# Patient Record
Sex: Male | Born: 1985 | Race: White | Hispanic: No | Marital: Single | Smoking: Never smoker
Health system: Southern US, Community
[De-identification: ages and names within clinical notes are randomized; demographics above are authoritative.]

## PROBLEM LIST (undated history)

## (undated) DIAGNOSIS — K7581 Nonalcoholic steatohepatitis (NASH): Secondary | ICD-10-CM

## (undated) DIAGNOSIS — Z8619 Personal history of other infectious and parasitic diseases: Secondary | ICD-10-CM

## (undated) HISTORY — DX: Nonalcoholic steatohepatitis (NASH): K75.81

## (undated) HISTORY — DX: Personal history of other infectious and parasitic diseases: Z86.19

## (undated) HISTORY — PX: HERNIA REPAIR: SHX51

---
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
## 2010-01-22 ENCOUNTER — Ambulatory Visit: Payer: Self-pay | Admitting: Family Medicine

## 2010-01-22 DIAGNOSIS — E669 Obesity, unspecified: Secondary | ICD-10-CM

## 2010-01-22 DIAGNOSIS — F639 Impulse disorder, unspecified: Secondary | ICD-10-CM | POA: Insufficient documentation

## 2010-01-22 DIAGNOSIS — R0789 Other chest pain: Secondary | ICD-10-CM | POA: Insufficient documentation

## 2010-01-22 DIAGNOSIS — J309 Allergic rhinitis, unspecified: Secondary | ICD-10-CM | POA: Insufficient documentation

## 2010-02-19 ENCOUNTER — Ambulatory Visit (HOSPITAL_COMMUNITY): Admission: RE | Admit: 2010-02-19 | Discharge: 2010-02-19 | Payer: Self-pay | Admitting: Family Medicine

## 2010-02-20 ENCOUNTER — Ambulatory Visit: Payer: Self-pay | Admitting: Family

## 2010-02-20 ENCOUNTER — Ambulatory Visit: Payer: Self-pay | Admitting: Internal Medicine

## 2010-02-20 LAB — CONVERTED CEMR LAB
ALT: 134 units/L — ABNORMAL HIGH (ref 0–53)
Albumin: 5.3 g/dL — ABNORMAL HIGH (ref 3.5–5.2)
Alkaline Phosphatase: 60 units/L (ref 39–117)
Basophils Absolute: 0.1 10*3/uL (ref 0.0–0.1)
Basophils Relative: 1 % (ref 0–1)
Bilirubin Urine: NEGATIVE
Bilirubin Urine: NEGATIVE
Chloride: 100 meq/L (ref 96–112)
Cholesterol: 220 mg/dL — ABNORMAL HIGH (ref 0–200)
Creatinine, Urine: 260.4 mg/dL
HDL: 49 mg/dL (ref >39)
Hemoglobin, Urine: NEGATIVE
Ketones, urine, test strip: NEGATIVE
LDL Cholesterol: 144 mg/dL — ABNORMAL HIGH (ref 0–99)
MCHC: 34.3 g/dL (ref 30.0–36.0)
Microalb, Ur: 0.5 mg/dL (ref 0.00–1.89)
Neutro Abs: 4.1 10*3/uL (ref 1.7–7.7)
Neutrophils Relative %: 60 % (ref 43–77)
Platelets: 286 10*3/uL (ref 150–400)
Potassium: 4.6 meq/L (ref 3.5–5.3)
Protein, ur: NEGATIVE mg/dL
RDW: 12.4 % (ref 11.5–15.5)
Specific Gravity, Urine: 1.02
Total Protein: 7.9 g/dL (ref 6.0–8.3)
Triglycerides: 137 mg/dL (ref <150)
Urobilinogen, UA: 0.2
Urobilinogen, UA: 0.2 (ref 0.0–1.0)
VLDL: 27 mg/dL (ref 0–40)
pH: 6

## 2010-02-24 ENCOUNTER — Telehealth (INDEPENDENT_AMBULATORY_CARE_PROVIDER_SITE_OTHER): Payer: Self-pay | Admitting: *Deleted

## 2010-02-24 DIAGNOSIS — R740 Nonspecific elevation of levels of transaminase and lactic acid dehydrogenase [LDH]: Secondary | ICD-10-CM

## 2010-02-26 ENCOUNTER — Encounter: Admission: RE | Admit: 2010-02-26 | Discharge: 2010-02-26 | Payer: Self-pay | Admitting: Family Medicine

## 2010-02-27 ENCOUNTER — Ambulatory Visit: Payer: Self-pay | Admitting: Family Medicine

## 2010-02-27 DIAGNOSIS — K7689 Other specified diseases of liver: Secondary | ICD-10-CM

## 2010-03-02 ENCOUNTER — Encounter (INDEPENDENT_AMBULATORY_CARE_PROVIDER_SITE_OTHER): Payer: Self-pay | Admitting: *Deleted

## 2010-03-16 ENCOUNTER — Encounter: Payer: Self-pay | Admitting: Family Medicine

## 2010-03-16 ENCOUNTER — Encounter
Admission: RE | Admit: 2010-03-16 | Discharge: 2010-03-16 | Payer: Self-pay | Source: Home / Self Care | Attending: Family Medicine | Admitting: Family Medicine

## 2010-03-19 ENCOUNTER — Telehealth: Payer: Self-pay | Admitting: Family Medicine

## 2010-05-05 ENCOUNTER — Telehealth (INDEPENDENT_AMBULATORY_CARE_PROVIDER_SITE_OTHER): Payer: Self-pay | Admitting: *Deleted

## 2010-06-18 NOTE — Miscellaneous (Signed)
Summary: Records from 1987 - 2006  Records from 1987 - 2006   Imported By: Maryln Gottron 03/02/2010 13:27:08  _____________________________________________________________________  External Attachment:    Type:   Image     Comment:   External Document

## 2010-06-18 NOTE — Letter (Signed)
Summary: New Patient letter  [facility]  [address] [place], [place] [address]   Phone: [phone]  Fax: [phone]       03/02/2010 MRN: [id number]  George Oconnell [address] [place], [place]  [address]  Dear George Oconnell,  Welcome to the Gastroenterology Division at [facility]    You are scheduled to see Dr.  Marina Goodell on 04-02-10 at 10:00a.m. on the 3rd floor at [facility], [address]  We ask that you try to arrive at our office 15 minutes prior to your appointment time to allow for check-in.  We would like you to complete the enclosed self-administered evaluation form prior to your visit and bring it with you on the day of your appointment.  We will review it with you.  Also, please bring a complete list of all your medications or, if you prefer, bring the medication bottles and we will list them.  Please bring your insurance card so that we may make a copy of it.  If your insurance requires a referral to see a specialist, please bring your referral form from your primary care physician.  Co-payments are due at the time of your visit and may be paid by cash, check or credit card.     Your office visit will consist of a consult with your physician (includes a physical exam), any laboratory testing he/she may order, scheduling of any necessary diagnostic testing (e.g. x-ray, ultrasound, CT-scan), and scheduling of a procedure (e.g. Endoscopy, Colonoscopy) if required.  Please allow enough time on your schedule to allow for any/all of these possibilities.    If you cannot keep your appointment, please call [phone] to cancel or reschedule prior to your appointment date.  This allows [place] the opportunity to schedule an appointment for another patient in need of care.  If you do not cancel or reschedule by 5 p.m. the business day prior to your appointment date, you will be charged a $50.00 late cancellation/no-show fee.    Thank you for  choosing [facility] for your medical needs.  We appreciate the opportunity to care for you.  Please visit [place] at our website  to learn more about our practice.                     Sincerely,                                                             The Gastroenterology Division

## 2010-06-18 NOTE — Progress Notes (Signed)
Summary: GI REFERRAL  Phone Note Outgoing Call Call back at [facility] Phone [phone]   Call placed by: Magdalen Spatz [facility],  March 19, 2010 8:44 AM Call placed to: Patient Summary of Call: IN REFERENCE TO GASTRO REFERRAL........Marland KitchenPATIENT WAS SCH'D TO SEE DR. PERRY ON 04-02-2010, I JUST S/W PATIENT WHO STATES HE HAS DECIDED TO PASS ON SEEING GASTRO & IS CALLING TO CANCEL ABOVE APPT.  Initial call taken by:  Magdalen Spatz [facility]  March 19, 2010 8:44 AM  Follow-up for Phone Call        noted. Follow-up by: Neena Rhymes MD,  March 19, 2010 8:52 AM

## 2010-06-18 NOTE — Progress Notes (Signed)
Summary: nasonex refill   Phone Note Refill Request Message from:  Fax from Pharmacy on May 05, 2010 8:29 AM  Refills Requested: Medication #1:  NASONEX 50 MCG/ACT SUSP 2 sprays each nostril once daily. [facility] [place] - fax [phone] --- *note wants rx for 2 canisters 34 g- patient leaving for 2 months trip to [place] wants to fill before flight*  Initial call taken by: Okey Regal [place],  May 05, 2010 8:30 AM    Prescriptions: NASONEX 50 MCG/ACT SUSP (MOMETASONE FUROATE) 2 sprays each nostril once daily  #2 x 1   Entered by:   Doristine Devoid CMA   Authorized by:   Neena Rhymes MD   Signed by:   Doristine Devoid CMA on 05/05/2010   Method used:   Telephoned to ...       [facility] (retail)       [address]       [place], [place]  [address]       Ph: [phone]       Fax: [phone]   RxID:   [id number]

## 2010-06-18 NOTE — Letter (Signed)
Summary: Student Medical Report/[facility]  Student Medical Report/[facility]   Imported By: Lanelle Bal 03/09/2010 11:30:02  _____________________________________________________________________  External Attachment:    Type:   Image     Comment:   External Document

## 2010-06-18 NOTE — Assessment & Plan Note (Signed)
Summary: cpx//form filled out//lch   Vital Signs:  Patient profile:   25 year old male Height:      69.25 inches Weight:      272 pounds BMI:     40.02 Temp:     97.9 degrees F oral Pulse rate:   66 / minute Pulse rhythm:   regular Resp:     16 per minute BP sitting:   120 / 90  (right arm) Cuff size:   large  Vitals Entered By: Mervin Kung CMA Duncan Dull) (February 20, 2010 8:59 AM) CC: Rm 5  Pt here to have form completed for college. Is Patient Diabetic? No Pain Assessment Patient in pain? no       Vision Screening:Left eye with correction: 20 / 30 Right eye with correction: 20 / 30        Vision Entered By: Mervin Kung CMA Duncan Dull) (February 20, 2010 9:14 AM)  Hearing Screen  20db HL: Left  500 hz: 40db 1000 hz: 40db 2000 hz: 40db 4000 hz: 40db Right  500 hz: 40db 1000 hz: 40db 2000 hz: 40db 4000 hz: 40db   Hearing Testing Entered By: Mervin Kung CMA Duncan Dull) (February 20, 2010 9:55 AM)   CC:  Rm 5  Pt here to have form completed for college.Marland Kitchen  History of Present Illness: Preventative-  Exercises Walks on treadmill, lifts weights.  Exercises 2-3 x a week.  Diet is improving, eating more salads, trying to stay away from fast food.  Preventive Screening-Counseling & Management  Alcohol-Tobacco     Alcohol drinks/day: 0     Smoking Status: never  Caffeine-Diet-Exercise     Caffeine use/day: none     Does Patient Exercise: yes     Times/week: <3  Allergies (verified): No Known Drug Allergies  Past History:  Past Medical History: Last updated: 01/22/2010 Allergic rhinitis hx of chicken pox  Past Surgical History: Last updated: 01/22/2010 Hernia repair as a child   Family History: Reviewed history from 01/22/2010 and no changes required. 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 Resp: Denies cough CV:  Denies Chest Pain or SOB GI:  Denies nausea or vomitting GU: Denies dysuria Lymphatic: Denies lymphadenopathy Musculoskeletal:  Denies muscle/joint pain Skin:  Denies Rashes or lesions Psychiatric: Denies depression or anxiety Neuro: Denies numbness     Physical Exam  General:  Overweight white male, awake, alert, and in NAD Head:  Normocephalic and atraumatic without obvious abnormalities. No apparent alopecia or balding. Eyes:  PERRLA, sclera clear Ears:  External ear exam shows no significant lesions or deformities.  Otoscopic examination reveals clear canals, tympanic membranes are intact bilaterally without bulging, retraction, inflammation or discharge. Hearing is grossly normal bilaterally. Mouth:  Oral mucosa and oropharynx without lesions or exudates.  Teeth in good repair. Neck:  No deformities, masses, or tenderness noted. Chest Wall:  No deformities, masses, tenderness or gynecomastia noted. Lungs:  Normal respiratory effort, chest expands symmetrically. Lungs are clear to auscultation, no crackles or wheezes. Heart:  Normal rate and regular rhythm. S1 and S2 normal without gallop, murmur, click, rub or other extra sounds. Abdomen:  Bowel sounds positive,abdomen soft and non-tender  without masses, organomegaly or hernias noted. Genitalia:  deferred Msk:  No deformity or scoliosis noted of thoracic or lumbar spine.   Pulses:  2+ DP pulses bilaterally Extremities:  No clubbing, cyanosis, edema, or deformity noted with normal full range of motion of all joints.   Neurologic:  No cranial nerve deficits noted. Station and gait are normal. Plantar reflexes are down-going bilaterally. DTRs are symmetrical throughout.  Sensory, motor and coordinative functions appear intact. Skin:  Intact without suspicious lesions or rashes Cervical Nodes:  No lymphadenopathy noted Psych:  Cognition and judgment appear intact. Alert and cooperative with normal attention span and concentration. No apparent delusions, illusions, hallucinations   Impression & Recommendations:  Problem # 1:  Preventive Health Care (ICD-V70.0) Patient declines flu shot today.  Recommended that he continue his healthy dietary changes and exercise efforts.  Form filled.   Orders: TLB-Lipid Panel (80061-LIPID) TLB-BMP (Basic Metabolic Panel-BMET) (80048-METABOL) TLB-CBC Platelet - w/Differential (85025-CBCD) TLB-Hepatic/Liver Function Pnl (80076-HEPATIC) TLB-TSH (Thyroid Stimulating Hormone) (84443-TSH) Audiometry (92552) UA Dipstick w/o Micro (manual) (91478) Specimen Handling (99000) T-Urinalysis (29562-13086) T-Urine Microalbumin w/creat. ratio [phone]) Form Completion (32440)  Problem # 2:  PROTEINURIA, MILD (ICD-791.0) Assessment: New Noted trace protein on dip- will send to lab for verification.  BP has been upper limits of normal last visit 140/80 and this visit 120/90.    Complete Medication List: 1)  Omeprazole 20 Mg Cpdr (Omeprazole) .... Take one tablet daily 2)  Nasonex 50 Mcg/act Susp (Mometasone furoate) .... 2 sprays each nostril once daily  Patient Instructions: 1)  Please keep up the good work with diet and exercise. 2)  Follow up with Dr. Beverely Low in 6 months. 3)  Have a nice trip!  Current Allergies (reviewed today): No known allergies       Contraindications/Deferment of Procedures/Staging:    Test/Procedure: FLU VAX    Reason for deferment: patient declined  Laboratory Results   Urine Tests   Date/Time Reported: Mervin Kung CMA Duncan Dull)  February 20, 2010 9:51 AM   Routine Urinalysis   Color: amber Appearance: Clear Glucose: negative   (Normal Range: Negative) Bilirubin: negative    (Normal Range: Negative) Ketone: negative   (Normal Range: Negative) Spec. Gravity: 1.020   (Normal Range: 1.003-1.035) Blood: negative   (Normal Range: Negative) pH: 6.0   (Normal Range: 5.0-8.0) Protein: trace   (Normal Range: Negative) Urobilinogen: 0.2   (Normal Range: 0-1) Nitrite: negative   (Normal Range: Negative) Leukocyte Esterace: negative   (Normal Range: Negative)    Comments: Urine sent for UA verification. Nicki Guadalajara Fergerson CMA Duncan Dull)  February 20, 2010 9:55 AM

## 2010-06-18 NOTE — Progress Notes (Signed)
  Phone Note Outgoing Call   Summary of Call: Please call patient and let him know that his calcium is high and his LFT's are high.  I would like for him to have an ultrasound of his liver (this has been ordered) Please inform patient and arrange a follow up visit with Dr. Beverely Low.   Initial call taken by: Lemont Fillers FNP,  February 24, 2010 8:22 AM  Follow-up for Phone Call        Pt notified of results and states he will call back to schedule time for U/s. Nicki Guadalajara Fergerson CMA Duncan Dull)  February 24, 2010 10:50 AM   Additional Follow-up for Phone Call Additional follow up Details #1::        please call pt and let him know it may be easier for [place] to schedule [place] and then have him change it if it isn't convenient. Additional Follow-up by: Neena Rhymes MD,  February 25, 2010 7:57 AM  New Problems: HYPERCALCEMIA (ICD-275.42) TRANSAMINASES, SERUM, ELEVATED (ICD-790.4)   Additional Follow-up for Phone Call Additional follow up Details #2::    Pt dad call to get some clarity on what was going on with pt. Inform pt dad of pt lab results and U/S that needs to be scheduled. Pt dad states that pt was a little hesitate to have procedure done but will discuss with pt and stress the important of having procedure done, Per pt dad pt will be back in touch in regard to getting U/S done...........Marland KitchenFelecia Deloach CMA  February 25, 2010 10:51 AM   New Problems: HYPERCALCEMIA (ICD-275.42) TRANSAMINASES, SERUM, ELEVATED (ICD-790.4)   Immunization History:  DPT Immunization History:    DPT # 1:  historical (05/14/1986)    DPT # 2:  historical (08/22/1986)    DPT # 3:  historical (09/10/1987)    DPT # 4:  historical (02/03/1989)    DPT # 5:  historical (03/07/1990)  HIB Immunization History:    HIB # 1:  historical (01/29/1988)    HIB # 2:  historical (05/27/1988)  Polio Immunization History:    Polio # 1:  historical (05/14/1986)    Polio # 2:  historical (08/22/1986)    Polio # 3:   historical (09/10/1987)    Polio # 4:  historical (03/07/1990)  MMR Immunization History:    MMR # 1:  historical (09/10/1987)    Preventive Care Screening  PPD:    Date:  12/25/2004    Results:  negative

## 2010-06-18 NOTE — Consult Note (Signed)
Summary: [facility]  [facility]   Imported By: Lanelle Bal 03/26/2010 13:45:34  _____________________________________________________________________  External Attachment:    Type:   Image     Comment:   External Document

## 2010-06-18 NOTE — Assessment & Plan Note (Signed)
Summary: review U/S results//fd   Vital Signs:  Patient profile:   25 year old male Height:      69.25 inches Weight:      275 pounds Temp:     98.1 degrees F oral Pulse rate:   80 / minute BP sitting:   118 / 82  (left arm)  Vitals Entered By: Jeremy Johann CMA (February 27, 2010 11:03 AM) CC: review U/S result   History of Present Illness: 25 yo man here today to review [place] results.  pt denies ETOH or tylenol use.  no abd pain.  not exercising.  poor diet.  Preventive Screening-Counseling & Management  Alcohol-Tobacco     Alcohol drinks/day: 0  Caffeine-Diet-Exercise     Does Patient Exercise: no  Current Medications (verified): 1)  Omeprazole 20 Mg Cpdr (Omeprazole) .... Take One Tablet Daily 2)  Nasonex 50 Mcg/act Susp (Mometasone Furoate) .... 2 Sprays Each Nostril Once Daily  Allergies (verified): No Known Drug Allergies  Past History:  Past Medical History: Allergic rhinitis hx of chicken pox steatohepatosis  Social History: Does Patient Exercise:  no  Review of Systems      See HPI  Physical Exam  General:  Overweight white male, awake, alert, and in NAD Psych:  mildly anxious   Impression & Recommendations:  Problem # 1:  FATTY LIVER DISEASE (ICD-571.8) Assessment New reviewed importance of healthy diet and regular exercise to prevent further fat deposition in liver.  will refer to nutrition.  Problem # 2:  TRANSAMINASES, SERUM, ELEVATED (ICD-790.4) Assessment: Unchanged wanted to repeat labs today and add hepatitis panel but pt declined.  wants to wait until he works on diet and exercise before having labs repeated.  Complete Medication List: 1)  Omeprazole 20 Mg Cpdr (Omeprazole) .... Take one tablet daily 2)  Nasonex 50 Mcg/act Susp (Mometasone furoate) .... 2 sprays each nostril once daily  Patient Instructions: 1)  Follow up in 6-8 weeks for liver issues 2)  I'll make your nutrition and GI appts 3)  Call with any questions or  concerns  Appended Document: review U/S results//fd    Clinical Lists Changes  Orders: Added new Referral order of Nutrition Referral (Nutrition) - Signed Added new Referral order of Gastroenterology Referral (GI) - Signed

## 2010-06-18 NOTE — Assessment & Plan Note (Signed)
Summary: new to est/cbs   Vital Signs:  Patient profile:   25 year old male Height:      69.25 inches Weight:      274 pounds BMI:     40.32 Pulse rate:   109 / minute BP sitting:   140 / 80  (left arm)  Vitals Entered By: Doristine Devoid CMA (January 22, 2010 3:18 PM) CC: new est- vibrating feeling in throat    History of Present Illness: 25 yo man here today to establish care.  Previous MD- [facility].  GI- [facility].  'strange feeling when i breathe'- sxs started 6 months ago.  had upper GI which was 'normal', started on PPI.  'there's a vibration in my chest'- particularly w/ a deep breath.  sxs were preceeding by URI.  denies SOB.  + 'mucousy feeling in my throat'.  + cough.  hx of seasonal allergies- not currently on meds.  denies CP pain or pressure.  feeling is less noticeable in the AM, more noticeable at night.   obesity- pt's BMI >40.  pt reports he has a hx of compulsive behaviors- first spending money, now eating.  family hx of addiction- brother is recovering substance abuser.  open to idea of exercise.  denies SI/HI.  father also obese- they admit to poor eating routinely.  Preventive Screening-Counseling & Management  Alcohol-Tobacco     Alcohol drinks/day: 0     Smoking Status: never  Caffeine-Diet-Exercise     Does Patient Exercise: no      Drug Use:  never.    Current Medications (verified): 1)  Omeprazole 20 Mg Cpdr (Omeprazole) .... 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 CV:  Complains of weight gain; denies chest pain or discomfort, difficulty breathing at night, fainting, fatigue, palpitations, swelling of feet, and swelling of hands. Resp:  Complains of chest discomfort, cough, and sputum productive; denies wheezing. GI:  Denies abdominal pain, change in bowel habits, nausea, and vomiting. Psych:  Denies anxiety, depression, panic attacks, suicidal thoughts/plans, thoughts of violence, and unusual visions or sounds.  Physical Exam  General:  Obese, well-developed,well-nourished,in no acute distress; alert,appropriate and cooperative throughout examination Head:  Normocephalic and atraumatic without obvious abnormalities. No apparent alopecia or balding. Ears:  External ear exam shows no significant lesions or deformities.  Otoscopic examination reveals clear canals, tympanic membranes are intact bilaterally without bulging, retraction, inflammation or discharge. Hearing is grossly normal bilaterally. Nose:  marked turbinate edema Mouth:  copious PND Neck:  No deformities, masses, or tenderness noted. Lungs:  Normal respiratory effort, chest expands symmetrically. Lungs are clear to auscultation, no crackles or wheezes. Heart:  Normal rate and regular rhythm. S1 and S2 normal without gallop, murmur, click, rub or other extra sounds. Abdomen:  obese, soft, NT/ND Skin:  turgor normal, color normal, and no rashes.   Cervical Nodes:  No lymphadenopathy noted Psych:  Cognition and judgment appear intact. Alert and cooperative with normal attention span and concentration.  No apparent delusions, illusions, hallucinations   Impression & Recommendations:  Problem # 1:  CHEST DISCOMFORT (ICD-786.59) Assessment New check CXR.  pt's sxs don't appear to be cardiac based on hx.  sxs more consistent w/ allergic rhinitis and PND.  start nasal steriod  in addition to PPI.  add OTC antihistamine.  reviewed supportive care and red flags that should prompt return.  Pt expresses understanding and is in agreement w/ this plan. Orders: T-2 View CXR (71020TC)  Problem # 2:  ALLERGIC RHINITIS (ICD-477.9) Assessment: New see above His updated medication list for this problem includes:    Nasonex 50 Mcg/act Susp (Mometasone furoate) .Marland Kitchen... 2 sprays each nostril once daily  Problem # 3:  OBESITY (ICD-278.00) Assessment: New pt is compulsively eating.  strongly recommended counseling in addition to healthy diet and regular exercise.  will follow closely.  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  pt to seek out counseling at school.  will follow.  Complete Medication List: 1)  Omeprazole 20 Mg Cpdr (Omeprazole) .... Take one tablet daily 2)  Nasonex 50 Mcg/act Susp (Mometasone furoate) .... 2 sprays each nostril once daily  Patient Instructions: 1)  Please schedule a follow-up appointment in 1 month for your complete physical- do not eat before this appt. 2)  Start the nasonex daily as directed- this will help with the postnasal drip 3)  Go to [address] to get your chest xray 4)  Add over the counter Claritin or Zyrtec for the allergy component 5)  Check into counseling at school 6)  Try and get regular exercise- at least 3 times/week for at least 30 minutes 7)  Call with any questions or concerns 8)  Welcome!  We're glad to have you!  Prescriptions: NASONEX 50 MCG/ACT SUSP (MOMETASONE FUROATE) 2 sprays each nostril once daily  #1 x 3   Entered and Authorized by:   Neena Rhymes MD   Signed by:   Neena Rhymes MD on 01/22/2010   Method used:   Print then Give to Patient   RxID:   [id number]

## 2010-10-14 ENCOUNTER — Encounter: Payer: Self-pay | Admitting: Family Medicine

## 2010-10-14 ENCOUNTER — Ambulatory Visit (INDEPENDENT_AMBULATORY_CARE_PROVIDER_SITE_OTHER): Payer: BC Managed Care – PPO | Admitting: Family Medicine

## 2010-10-14 VITALS — BP 124/86 | Temp 97.8°F | Wt 273.0 lb

## 2010-10-14 DIAGNOSIS — K7689 Other specified diseases of liver: Secondary | ICD-10-CM

## 2010-10-14 NOTE — Patient Instructions (Signed)
Follow up in 6 months for your complete physical We'll notify you of your lab results Continue to make healthy food choices and get regular exercise Call with any questions or concerns Have a great time in [place]!!!

## 2010-10-14 NOTE — Progress Notes (Signed)
  Subjective:    Patient ID: George Oconnell, male    DOB: Feb 14, 1986, 25 y.o.   MRN: [id number]  HPI  Fatty liver- at last visit pt had decided to work on diet and exercise.  Reports he did a lot of exercise in [place] and now clothes are too big but weight is stable from last visit.  Denies jaundice- reports yellowed finger nails are from recent hair dye.  Toenails are not yellowed.  No N/V.  Review of Systems For ROS see HPI     Objective:   Physical Exam  Constitutional: He is oriented to person, place, and time. He appears well-developed and well-nourished. No distress.       overweight  Eyes: Conjunctivae and EOM are normal. Pupils are equal, round, and reactive to light.  Neck: Neck supple. No thyromegaly present.  Cardiovascular: Normal rate, regular rhythm, normal heart sounds and intact distal pulses.   No murmur heard. Pulmonary/Chest: Effort normal and breath sounds normal. No respiratory distress. He has no wheezes.  Abdominal: Soft. Bowel sounds are normal. He exhibits no distension. There is no tenderness. There is no rebound.  Musculoskeletal: He exhibits no edema.  Lymphadenopathy:    He has no cervical adenopathy.  Neurological: He is alert and oriented to person, place, and time. No cranial nerve deficit. Coordination normal.  Skin: Skin is warm and dry.       No apparent jaundice          Assessment & Plan:

## 2010-10-15 ENCOUNTER — Encounter: Payer: Self-pay | Admitting: *Deleted

## 2010-10-15 LAB — HEPATIC FUNCTION PANEL
ALT: 99 U/L — ABNORMAL HIGH (ref 0–53)
AST: 59 U/L — ABNORMAL HIGH (ref 0–37)
Albumin: 4.5 g/dL (ref 3.5–5.2)
Total Bilirubin: 0.7 mg/dL (ref 0.3–1.2)

## 2010-10-15 LAB — LIPID PANEL
Cholesterol: 178 mg/dL (ref 0–200)
LDL Cholesterol: 107 mg/dL — ABNORMAL HIGH (ref 0–99)
Triglycerides: 83 mg/dL (ref 0.0–149.0)

## 2010-10-18 NOTE — Assessment & Plan Note (Signed)
Pt has improved diet and was walking regularly while in [place].  Due for repeat labs today.  Encouraged him to continue his healthy lifestyle even though he has returned from his time abroad.  Will follow.

## 2011-02-10 IMAGING — CR DG CHEST 2V
2 series · 2 of 2 positions shown · non-contrast
Comparison: None.

CLINICAL DATA: 23-year-old male with chest discomfort.

CHEST - 2 VIEW

[w chest pa]
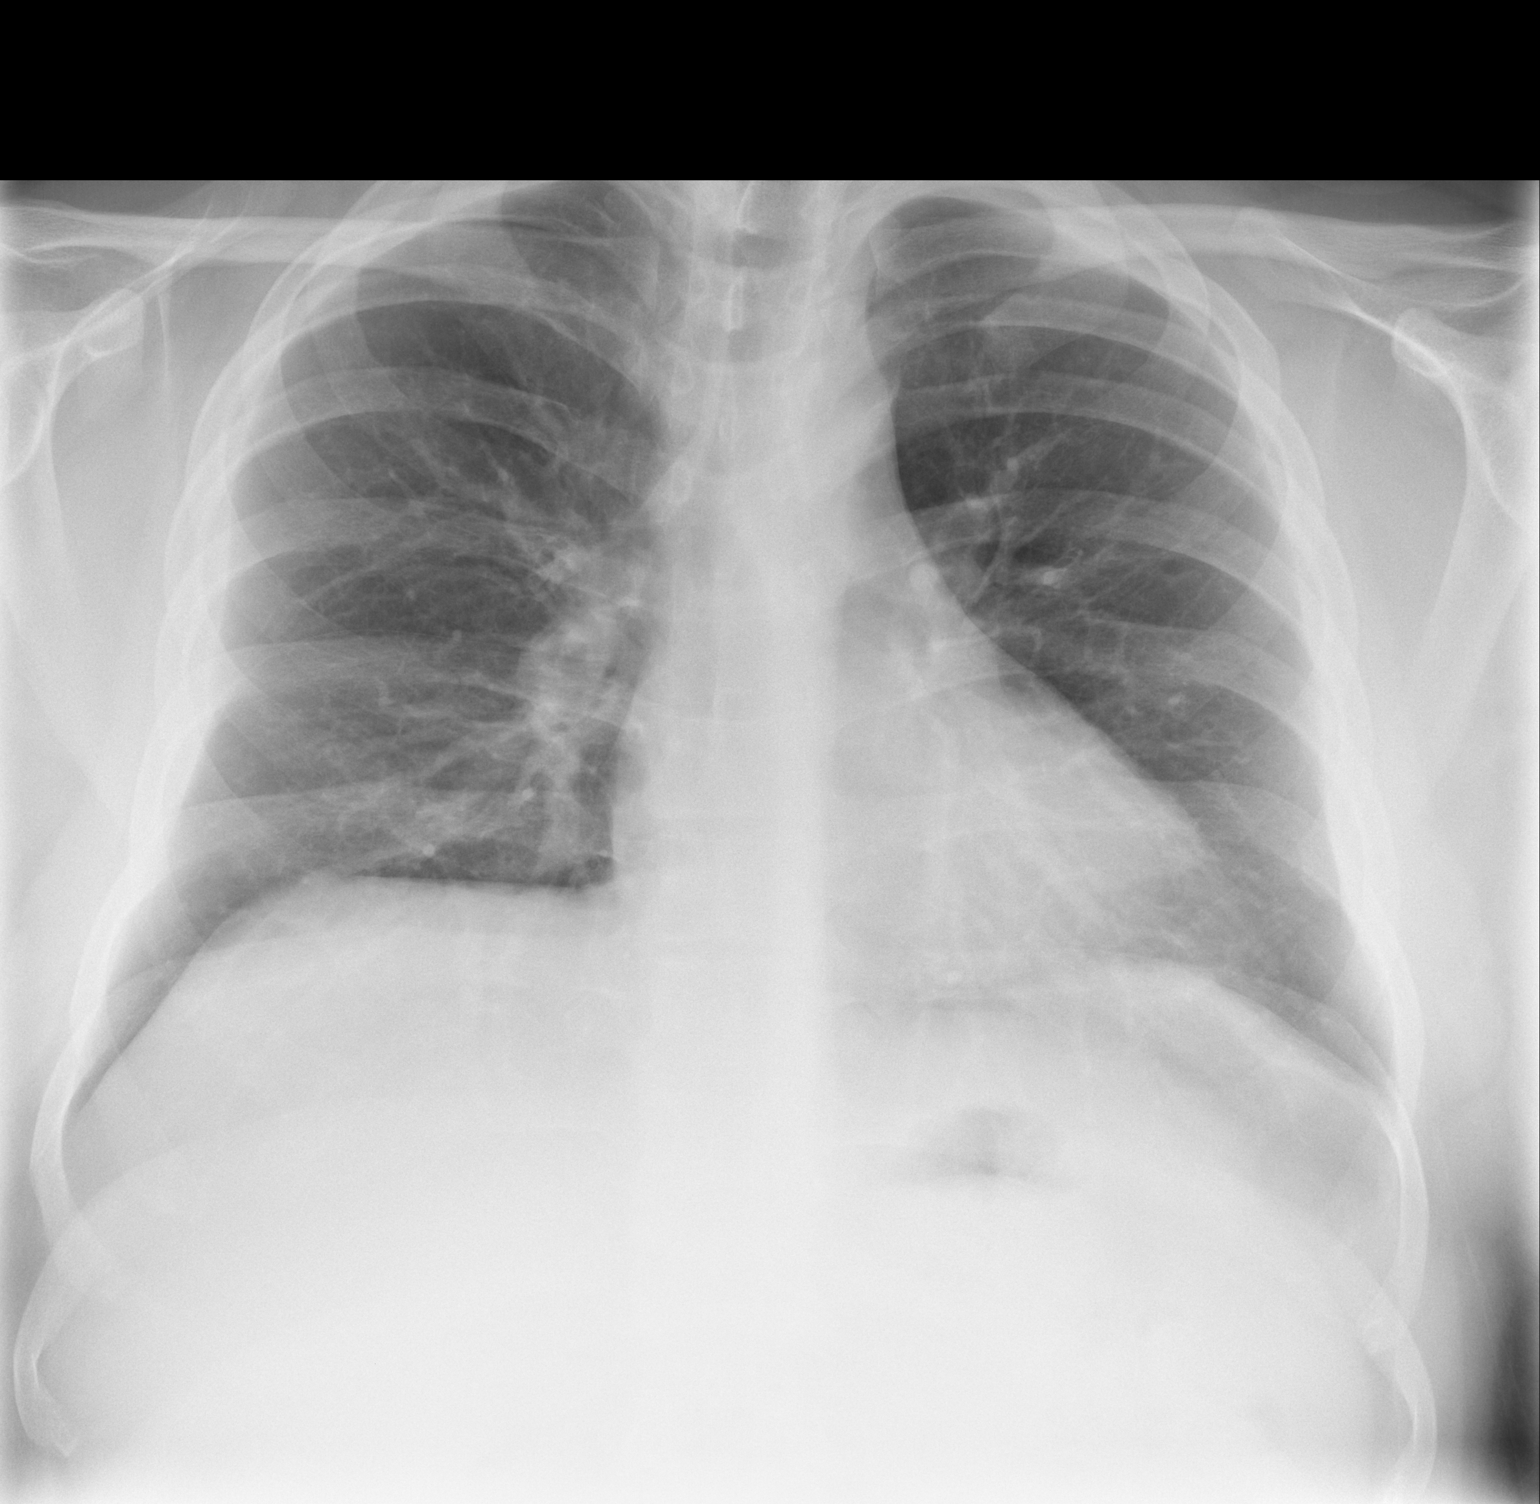

[w chest lat]
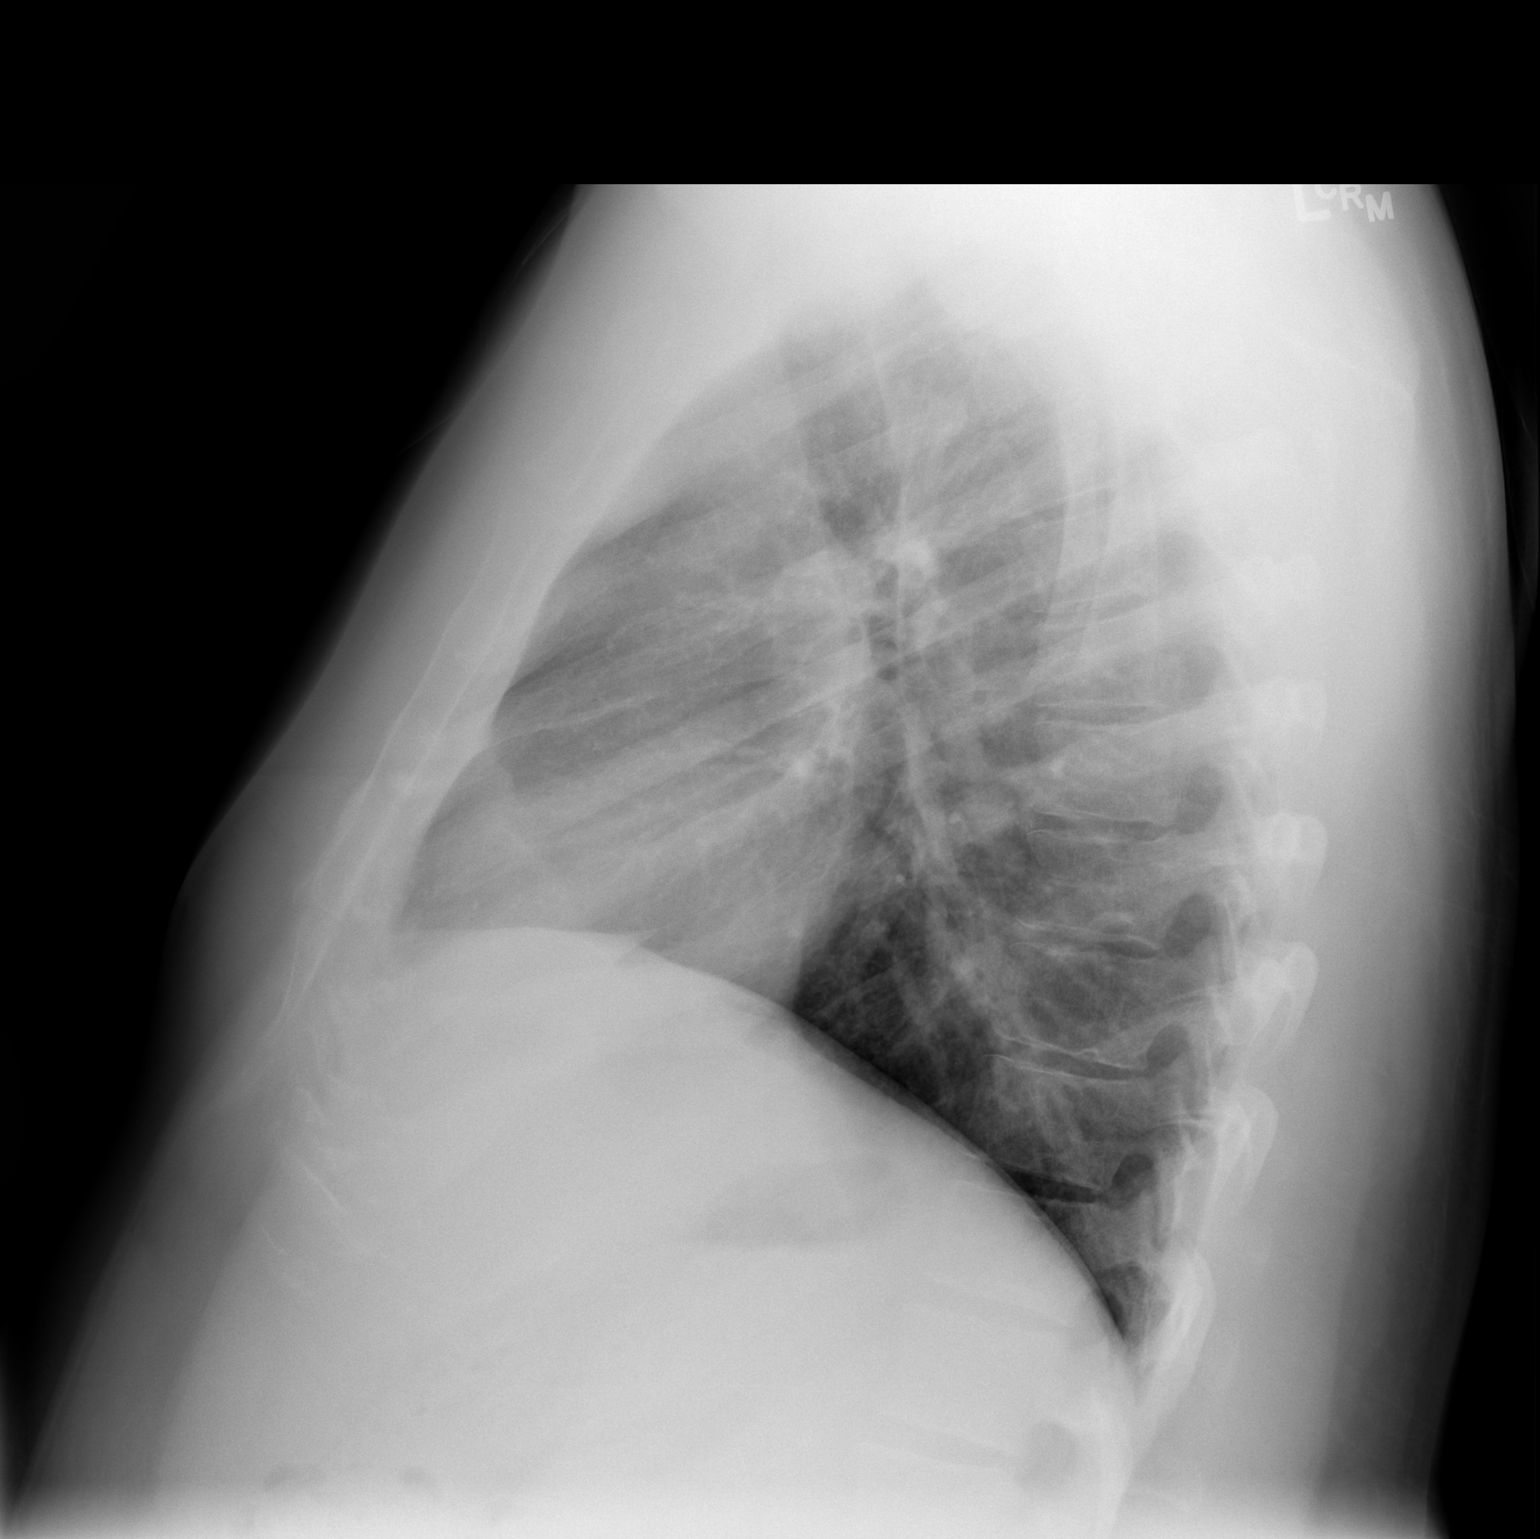

[2 of 2 positions shown; findings below may reference images not displayed]

FINDINGS: Slightly shallow lung volumes. Normal cardiac size and
mediastinal contours.  Visualized tracheal air column is within
normal limits.  The lungs are clear.  No pleural effusion or
pneumothorax. No acute osseous abnormality identified.
IMPRESSION: Slightly shallow lung volumes, otherwise negative.

## 2011-02-17 IMAGING — US US ABDOMEN COMPLETE
1 series · 14 of 25 positions shown · non-contrast
Comparison: None.

CLINICAL DATA: 24-year-old male with elevated liver function test.

COMPLETE ABDOMINAL ULTRASOUND

[Series 1: us abdomen complete · 0.32mm/px · 14 of 69 slices shown]
[im 1/69]
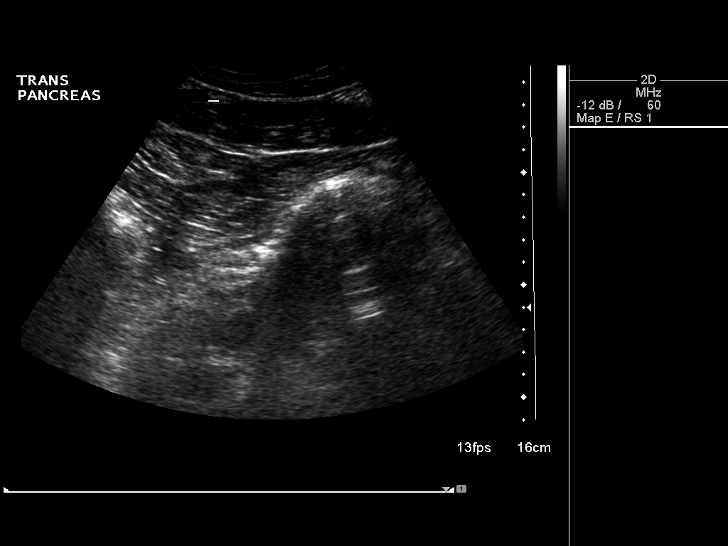
[im 6/69]
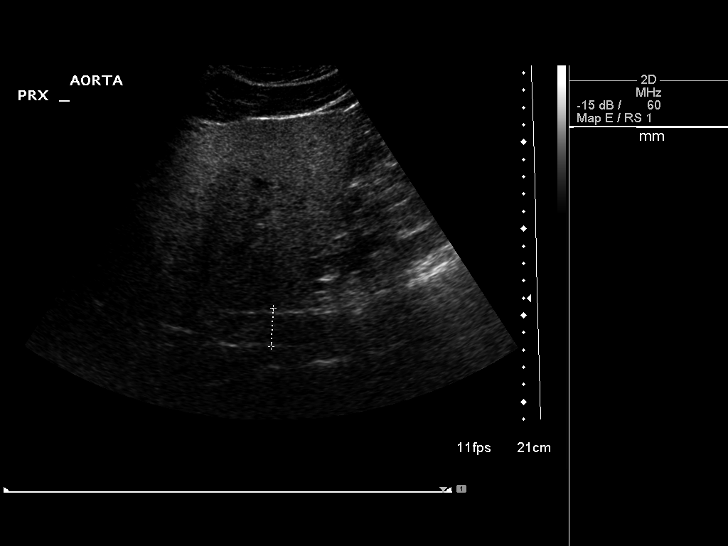
[im 12/69]
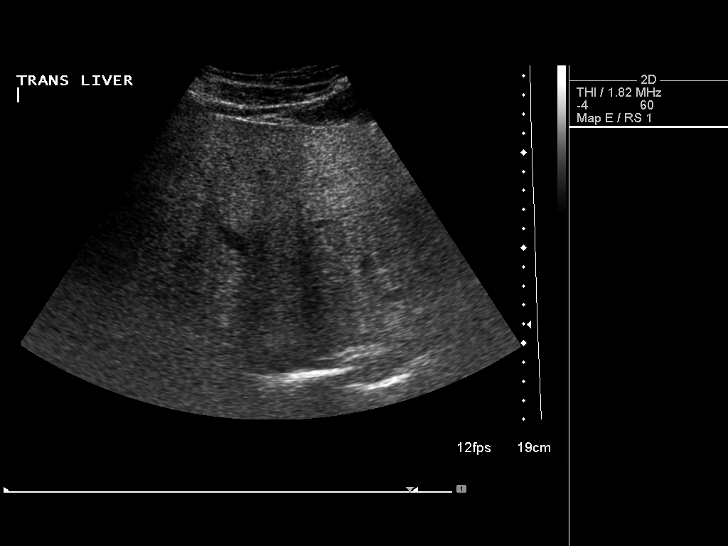
[im 18/69]
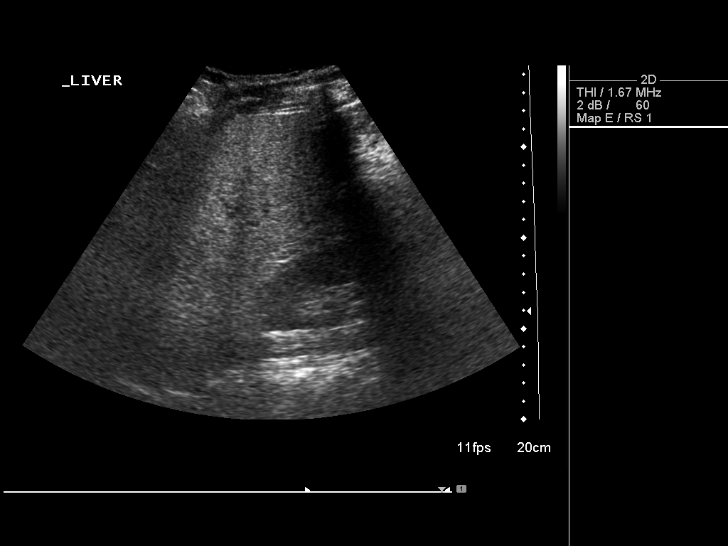
[im 23/69]
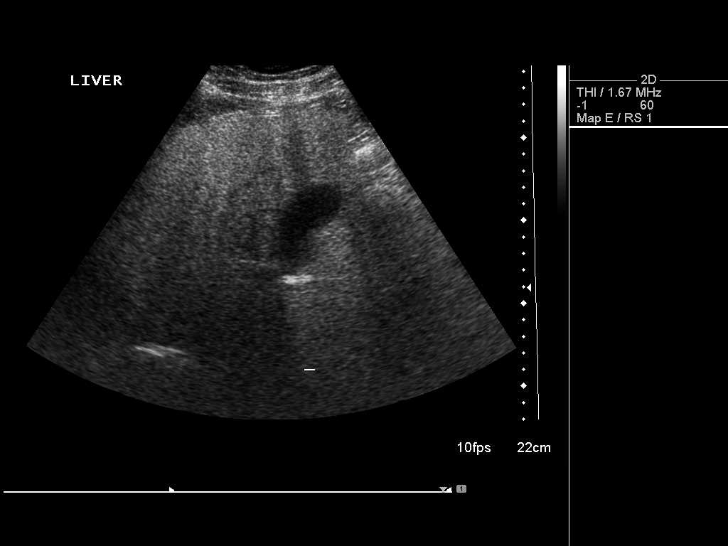
[im 26/69]
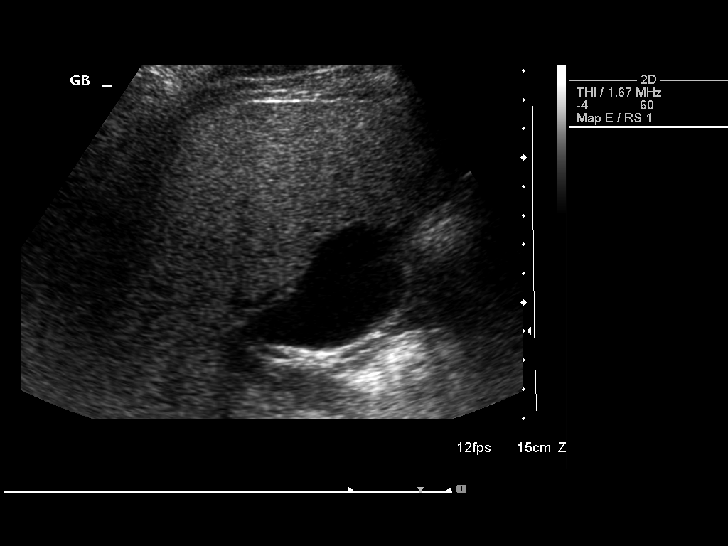
[im 32/69]
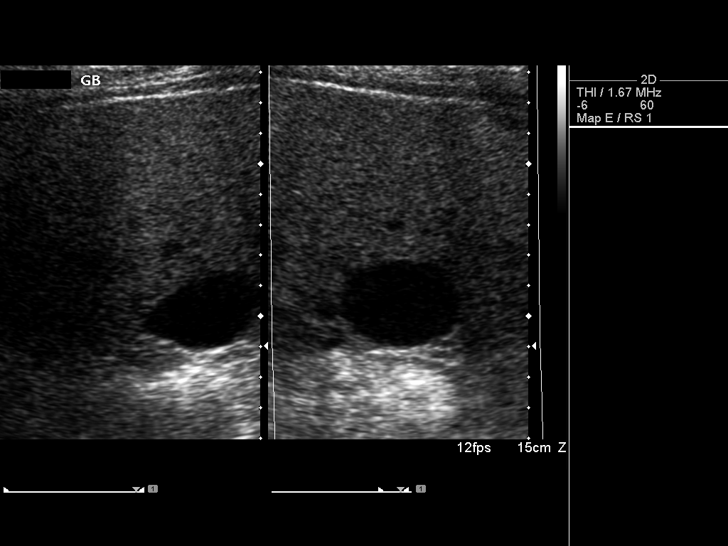
[im 37/69]
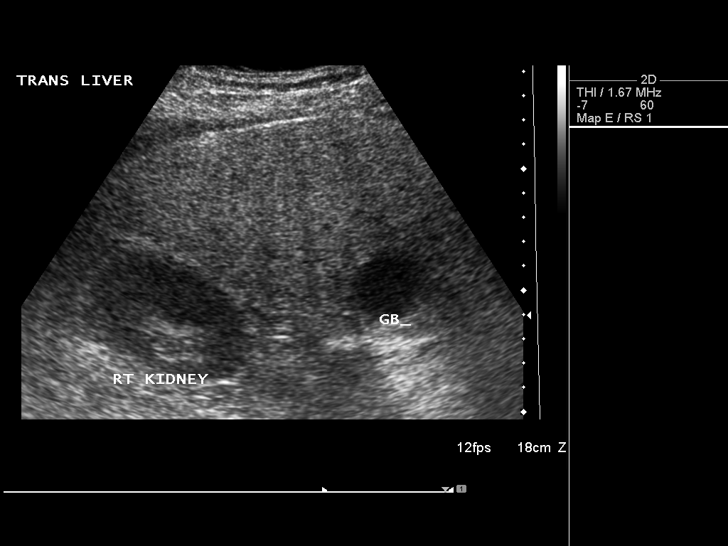
[im 43/69]
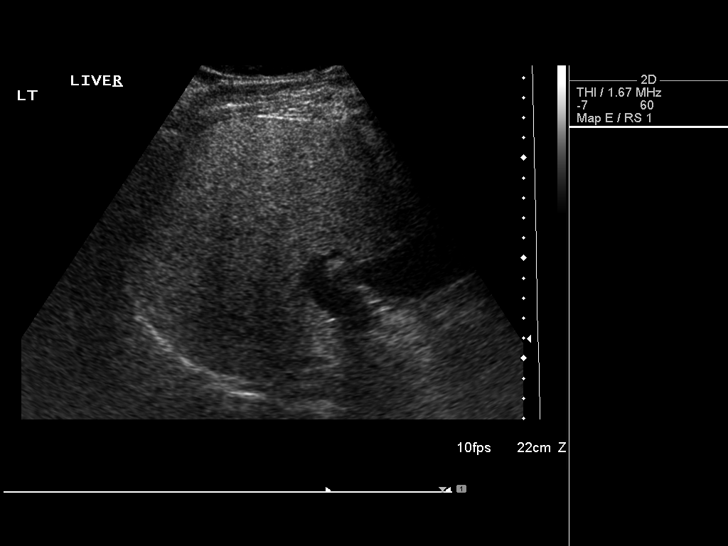
[im 46/69]
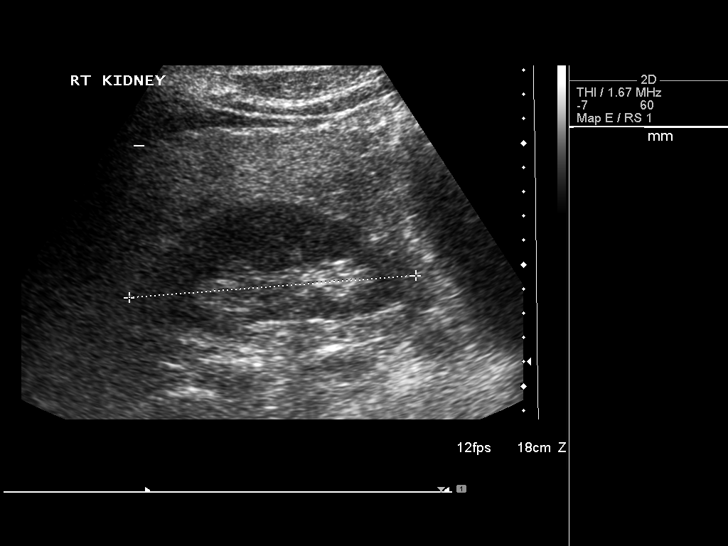
[im 52/69]
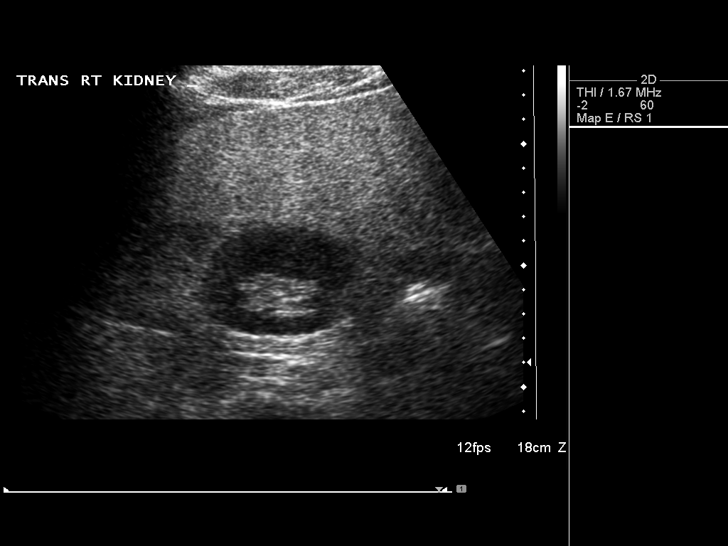
[im 57/69]
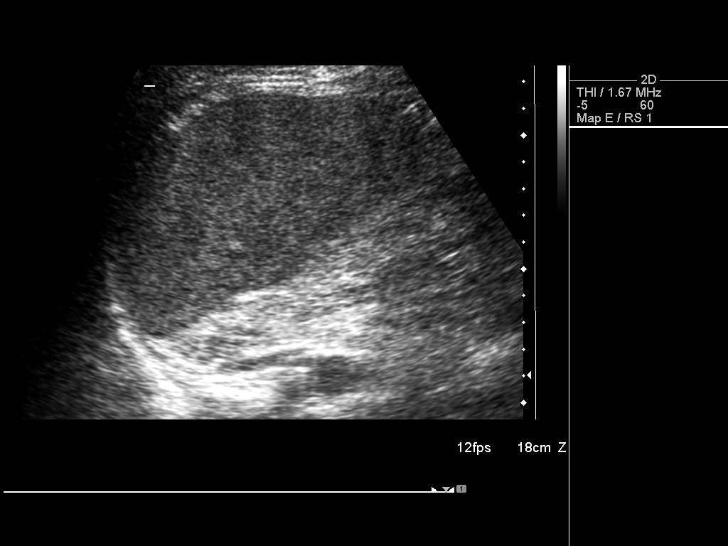
[im 63/69]
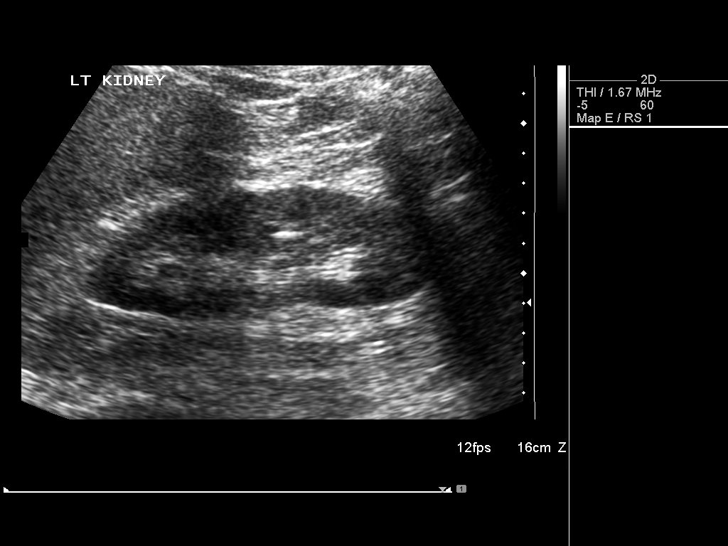
[im 69/69]
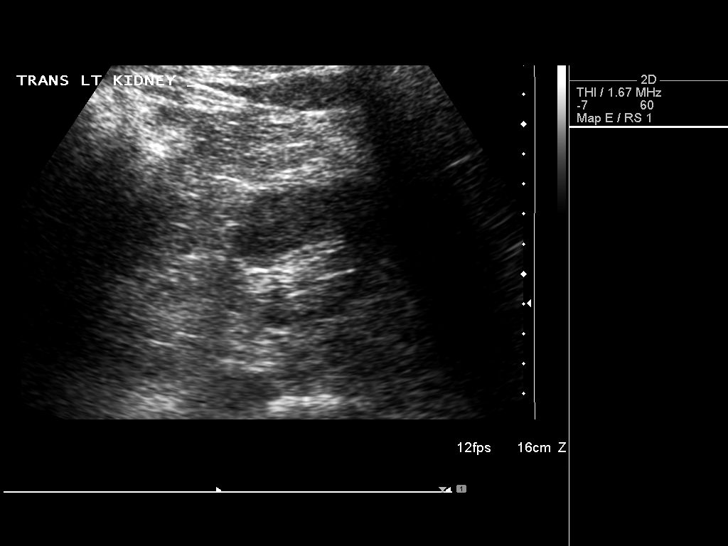

[14 of 25 positions shown; findings below may reference images not displayed]

FINDINGS: Gallbladder:  No gallstones, gallbladder wall thickening, or
pericholecystic fluid.

Common bile duct:  Normal measuring 4 mm in diameter.

Liver:  Diffusely hyperechogenic.  No focal lesion identified.  No
perihepatic fluid.

IVC:  Incompletely visualized due to overlying bowel gas,
visualized portions within normal limits.

Pancreas:  Not visualized due to overlying bowel gas.

Spleen:  Normal measuring 11.5 cm in length.

Right Kidney:  Normal measuring 11.8 cm in length.

Left Kidney:  Normal measuring 11.7 cm in length.

Abdominal aorta:  Incompletely visualized due to overlying bowel
gas, visualized portions within normal limits.
IMPRESSION: 1.  Hepatic steatosis.
2.  Otherwise negative.

## 2014-11-27 ENCOUNTER — Emergency Department (HOSPITAL_COMMUNITY): Payer: BLUE CROSS/BLUE SHIELD

## 2014-11-27 ENCOUNTER — Encounter (HOSPITAL_COMMUNITY): Payer: Self-pay | Admitting: Emergency Medicine

## 2014-11-27 ENCOUNTER — Emergency Department (HOSPITAL_COMMUNITY)
Admission: EM | Admit: 2014-11-27 | Discharge: 2014-11-28 | Disposition: A | Discharge status: Home / Self Care | Payer: BLUE CROSS/BLUE SHIELD | Attending: Emergency Medicine | Admitting: Emergency Medicine

## 2014-11-27 DIAGNOSIS — R51 Headache: Secondary | ICD-10-CM | POA: Diagnosis not present

## 2014-11-27 DIAGNOSIS — R509 Fever, unspecified: Secondary | ICD-10-CM

## 2014-11-27 DIAGNOSIS — Z8619 Personal history of other infectious and parasitic diseases: Secondary | ICD-10-CM | POA: Diagnosis not present

## 2014-11-27 DIAGNOSIS — Z8719 Personal history of other diseases of the digestive system: Secondary | ICD-10-CM | POA: Diagnosis not present

## 2014-11-27 DIAGNOSIS — Z79899 Other long term (current) drug therapy: Secondary | ICD-10-CM | POA: Diagnosis not present

## 2014-11-27 LAB — COMPREHENSIVE METABOLIC PANEL
ALBUMIN: 4.1 g/dL (ref 3.5–5.0)
ALK PHOS: 50 U/L (ref 38–126)
ALT: 76 U/L — ABNORMAL HIGH (ref 17–63)
ANION GAP: 9 (ref 5–15)
AST: 51 U/L — ABNORMAL HIGH (ref 15–41)
BUN: 8 mg/dL (ref 6–20)
CO2: 25 mmol/L (ref 22–32)
CREATININE: 1.3 mg/dL — AB (ref 0.61–1.24)
Calcium: 9.2 mg/dL (ref 8.9–10.3)
Chloride: 101 mmol/L (ref 101–111)
GFR calc Af Amer: >60 mL/min (ref >60)
Glucose, Bld: 141 mg/dL — ABNORMAL HIGH (ref 65–99)
POTASSIUM: 3.4 mmol/L — AB (ref 3.5–5.1)
SODIUM: 135 mmol/L (ref 135–145)
Total Bilirubin: 0.6 mg/dL (ref 0.3–1.2)
Total Protein: 7.1 g/dL (ref 6.5–8.1)

## 2014-11-27 LAB — URINALYSIS, ROUTINE W REFLEX MICROSCOPIC
Bilirubin Urine: NEGATIVE
Glucose, UA: NEGATIVE mg/dL
Hgb urine dipstick: NEGATIVE
Ketones, ur: 15 mg/dL — AB
LEUKOCYTES UA: NEGATIVE
NITRITE: NEGATIVE
PH: 5.5 (ref 5.0–8.0)
Protein, ur: NEGATIVE mg/dL
Specific Gravity, Urine: 1.03 (ref 1.005–1.030)
UROBILINOGEN UA: 0.2 mg/dL (ref 0.0–1.0)

## 2014-11-27 LAB — RAPID STREP SCREEN (MED CTR MEBANE ONLY): STREPTOCOCCUS, GROUP A SCREEN (DIRECT): NEGATIVE

## 2014-11-27 LAB — CBC
HEMATOCRIT: 41.7 % (ref 39.0–52.0)
HEMOGLOBIN: 14.1 g/dL (ref 13.0–17.0)
MCH: 28.4 pg (ref 26.0–34.0)
MCHC: 33.8 g/dL (ref 30.0–36.0)
MCV: 84.1 fL (ref 78.0–100.0)
Platelets: 199 10*3/uL (ref 150–400)
RBC: 4.96 MIL/uL (ref 4.22–5.81)
RDW: 12.7 % (ref 11.5–15.5)
WBC: 5.4 10*3/uL (ref 4.0–10.5)

## 2014-11-27 LAB — I-STAT CG4 LACTIC ACID, ED
LACTIC ACID, VENOUS: 1.9 mmol/L (ref 0.5–2.0)
LACTIC ACID, VENOUS: 0.89 mmol/L (ref 0.5–2.0)

## 2014-11-27 MED ORDER — ACETAMINOPHEN 325 MG PO TABS
650.0000 mg | ORAL_TABLET | Freq: Once | ORAL | Status: AC | PRN
  Administered 2014-11-27: 650 mg via ORAL
  Filled 2014-11-27: qty 2

## 2014-11-27 NOTE — ED Provider Notes (Signed)
CSN: [id number]     Arrival date & time 11/27/14  1938 History   First MD Initiated Contact with Patient 11/27/14 2244     Chief Complaint  Patient presents with  . Fever  . Generalized Body Aches  . tick bite      (Consider location/radiation/quality/duration/timing/severity/associated sxs/prior Treatment) HPI Comments: Patient is a 29 yo M presenting to the ED for evaluation of tactile fever and chills that began last evening. Patient states he had associated myalgias and arthalgias. Denies any associated cough, nasal congestion, sore throat, vomiting, diarrhea, rashes. He does endorse generalized throbbing headache and has been on and off since Monday. He tried Motrin and Tylenol once last evening and once this morning with minimal improvement of symptoms. He does endorse that he pulled a tick off of himself this morning.  Patient is a 29 y.o. male presenting with fever.  Fever Temp source:  Tactile Onset quality:  Sudden Duration:  1 day Chronicity:  New Relieved by:  Acetaminophen and ibuprofen Worsened by:  Nothing tried Ineffective treatments:  None tried Associated symptoms: chills, headaches and myalgias   Associated symptoms: no cough, no diarrhea, no dysuria, no ear pain, no nausea, no rash, no rhinorrhea, no sore throat and no vomiting   Risk factors: no sick contacts     Past Medical History  Diagnosis Date  . Allergic rhinitis   . History of chicken pox   . Steatohepatitis    Past Surgical History  Procedure Laterality Date  . Hernia repair      as a child   Family History  Problem Relation Age of Onset  . Coronary artery disease Paternal Grandmother   . Hypertension Paternal Grandmother   . Diabetes Paternal Grandmother   . Stroke      uncle  . Prostate cancer Paternal Grandfather   . Prostate cancer      paternal great grandfather   History  Substance Use Topics  . Smoking status: Never Smoker   . Smokeless tobacco: Not on file  . Alcohol Use: No      Review of Systems  Constitutional: Positive for fever and chills.  HENT: Negative for ear pain, rhinorrhea and sore throat.   Respiratory: Negative for cough.   Gastrointestinal: Negative for nausea, vomiting and diarrhea.  Genitourinary: Negative for dysuria.  Musculoskeletal: Positive for myalgias.  Skin: Negative for rash.  Neurological: Positive for headaches.  All other systems reviewed and are negative.     Allergies  Review of patient's allergies indicates no known allergies.  Home Medications   Prior to Admission medications   Medication Sig Start Date End Date Taking? Authorizing Provider  omeprazole (PRILOSEC) 20 MG capsule Take 20 mg by mouth daily.     Yes Historical Provider, MD   BP 138/88 mmHg  Pulse 103  Temp(Src) 100 F (37.8 C) (Oral)  Resp 20  SpO2 95% Physical Exam  Constitutional: He is oriented to person, place, and time. He appears well-developed and well-nourished. No distress.  HENT:  Head: Normocephalic and atraumatic.  Right Ear: External ear normal.  Left Ear: External ear normal.  Nose: Nose normal.  Mouth/Throat: Uvula is midline, oropharynx is clear and moist and mucous membranes are normal. No oropharyngeal exudate.  Eyes: Conjunctivae are normal.  Neck: Normal range of motion. Neck supple.  No nuchal rigidity.   Cardiovascular: Normal rate, regular rhythm and normal heart sounds.   Pulmonary/Chest: Effort normal and breath sounds normal.  Abdominal: Soft. There is no tenderness.  Musculoskeletal: Normal range of motion.  Lymphadenopathy:    He has no cervical adenopathy.  Neurological: He is alert and oriented to person, place, and time. GCS eye subscore is 4. GCS verbal subscore is 5. GCS motor subscore is 6.  Skin: Skin is warm and dry. No rash noted. He is not diaphoretic.     Psychiatric: He has a normal mood and affect.  Nursing note and vitals reviewed.   ED Course  Procedures (including critical care  time) Medications  acetaminophen (TYLENOL) tablet 650 mg (650 mg Oral Given 11/27/14 2027)    Labs Review Labs Reviewed  COMPREHENSIVE METABOLIC PANEL - Abnormal; Notable for the following:    Potassium 3.4 (*)    Glucose, Bld 141 (*)    Creatinine, Ser 1.30 (*)    AST 51 (*)    ALT 76 (*)    All other components within normal limits  URINALYSIS, ROUTINE W REFLEX MICROSCOPIC (NOT AT [facility]) - Abnormal; Notable for the following:    Ketones, ur 15 (*)    All other components within normal limits  RAPID STREP SCREEN (NOT AT [facility])  URINE CULTURE  CULTURE, GROUP A STREP  CBC  I-STAT CG4 LACTIC ACID, ED  I-STAT CG4 LACTIC ACID, ED    Imaging Review Dg Chest 2 View  11/27/2014   CLINICAL DATA:  Low-grade fever and body aches. Weakness since last night.  EXAM: CHEST  2 VIEW  COMPARISON:  02/19/2010  FINDINGS: heart size is normal. Mediastinal shadows are normal. There slightly increased interstitial markings diffusely that could go along with viral/interstitial pneumonitis. No consolidation, collapse or effusion. Bony structures are unremarkable.  IMPRESSION: Increased in diffuse interstitial markings that could go along with viral pneumonitis. No consolidation or collapse.   Electronically Signed   By: Paulina FusiMark  Shogry M.D.   On: 11/27/2014 20:44     EKG Interpretation None      MDM   Final diagnoses:  Acute febrile illness    Filed Vitals:   11/28/14 0130  BP: 138/88  Pulse: 103  Temp:   Resp:    Patient presenting with fever to ED. Pt alert, active, and oriented per age. PE showed lungs clear to auscultation bilaterally. Oropharynx clear without trismus or uvula deviation. Abdomen is soft, nontender, nondistended. No rashes./Tick bite visualized without acute evidence of localized infection. No nuchal rigidity or toxicity to suggest meningitis. Pt tolerating PO liquids in ED without difficulty. Tylenol given and improvement of fever. Tachycardia also improved with fever  reduction. Rapid strep negative. Chest x-ray unremarkable. No evidence of UTI on UA. AST and ALT mildly elevated, consistent with steatohepatitis. Likely viral infection in nature. Advised pediatrician follow up in 1-2 days. Return precautions discussed. Patient agreeable to plan. Stable at time of discharge.      Francee PiccoloJennifer Takara Sermons, PA-C 11/28/14 2224  Bethann BerkshireJoseph Zammit, MD 12/02/14 [phone]

## 2014-11-27 NOTE — ED Notes (Addendum)
C/o fever and generalized body aches since last night.  Reports tick on R side that was removed this morning.  States he got Tdap injection yesterday.  Denies pain at present.

## 2014-11-28 NOTE — Discharge Instructions (Signed)
Please follow up with your primary care physician in 1-2 days. If you do not have one please call the [facility] number listed above. Please alternate between Motrin and Tylenol every three hours for fevers and pain.  Please read all discharge instructions and return precautions.    Fever, Adult A fever is a higher than normal body temperature. In an adult, an oral temperature around 98.6 F (37 C) is considered normal. A temperature of 100.4 F (38 C) or higher is generally considered a fever. Mild or moderate fevers generally have no long-term effects and often do not require treatment. Extreme fever (greater than or equal to 106 F or 41.1 C) can cause seizures. The sweating that may occur with repeated or prolonged fever may cause dehydration. Elderly people can develop confusion during a fever. A measured temperature can vary with:  Age.  Time of day.  Method of measurement (mouth, underarm, rectal, or ear). The fever is confirmed by taking a temperature with a thermometer. Temperatures can be taken different ways. Some methods are accurate and some are not.  An oral temperature is used most commonly. Electronic thermometers are fast and accurate.  An ear temperature will only be accurate if the thermometer is positioned as recommended by the manufacturer.  A rectal temperature is accurate and done for those adults who have a condition where an oral temperature cannot be taken.  An underarm (axillary) temperature is not accurate and not recommended. Fever is a symptom, not a disease.  CAUSES   Infections commonly cause fever.  Some noninfectious causes for fever include:  Some arthritis conditions.  Some thyroid or adrenal gland conditions.  Some immune system conditions.  Some types of cancer.  A medicine reaction.  High doses of certain street drugs such as methamphetamine.  Dehydration.  Exposure to high outside or room  temperatures.  Occasionally, the source of a fever cannot be determined. This is sometimes called a "fever of unknown origin" (FUO).  Some situations may lead to a temporary rise in body temperature that may go away on its own. Examples are:  Childbirth.  Surgery.  Intense exercise. HOME CARE INSTRUCTIONS   Take appropriate medicines for fever. Follow dosing instructions carefully. If you use acetaminophen to reduce the fever, be careful to avoid taking other medicines that also contain acetaminophen. Do not take aspirin for a fever if you are younger than age 29. There is an association with Reye's syndrome. Reye's syndrome is a rare but potentially deadly disease.  If an infection is present and antibiotics have been prescribed, take them as directed. Finish them even if you start to feel better.  Rest as needed.  Maintain an adequate fluid intake. To prevent dehydration during an illness with prolonged or recurrent fever, you may need to drink extra fluid.Drink enough fluids to keep your urine clear or pale yellow.  Sponging or bathing with room temperature water may help reduce body temperature. Do not use ice water or alcohol sponge baths.  Dress comfortably, but do not over-bundle. SEEK MEDICAL CARE IF:   You are unable to keep fluids down.  You develop vomiting or diarrhea.  You are not feeling at least partly better after 3 days.  You develop new symptoms or problems. SEEK IMMEDIATE MEDICAL CARE IF:   You have shortness of breath or trouble breathing.  You develop excessive weakness.  You are dizzy or you faint.  You are extremely thirsty or you are making little or  You develop new pain that was not there before (such as in the head, neck, chest, back, or abdomen). °· You have persistent vomiting and diarrhea for more than 1 to 2 days. °· You develop a stiff neck or your eyes become sensitive to light. °· You develop a skin rash. °· You have a fever or  persistent symptoms for more than 2 to 3 days. °· You have a fever and your symptoms suddenly get worse. °MAKE SURE YOU:  °· Understand these instructions. °· Will watch your condition. °· Will get help right away if you are not doing well or get worse. °Document Released: 10/27/2000 Document Revised: 09/17/2013 Document Reviewed: 03/04/2011 °[employer]® Patient Information ©2015 [employer]. This information is not intended to replace advice given to you by your health care provider. Make sure you discuss any questions you have with your health care provider. ° ° ° °

## 2014-11-28 NOTE — ED Notes (Signed)
Pt stable, ambulatory, states understanding of discharge instructions 

## 2014-11-29 LAB — URINE CULTURE: Culture: NO GROWTH

## 2014-11-30 LAB — CULTURE, GROUP A STREP: STREP A CULTURE: NEGATIVE

## 2015-11-18 IMAGING — DX DG CHEST 2V
2 series · 2 of 2 positions shown · non-contrast
Comparison: 02/19/2010

CLINICAL DATA: Low-grade fever and body aches. Weakness since last
night.

EXAM:
CHEST  2 VIEW

[chest pa]
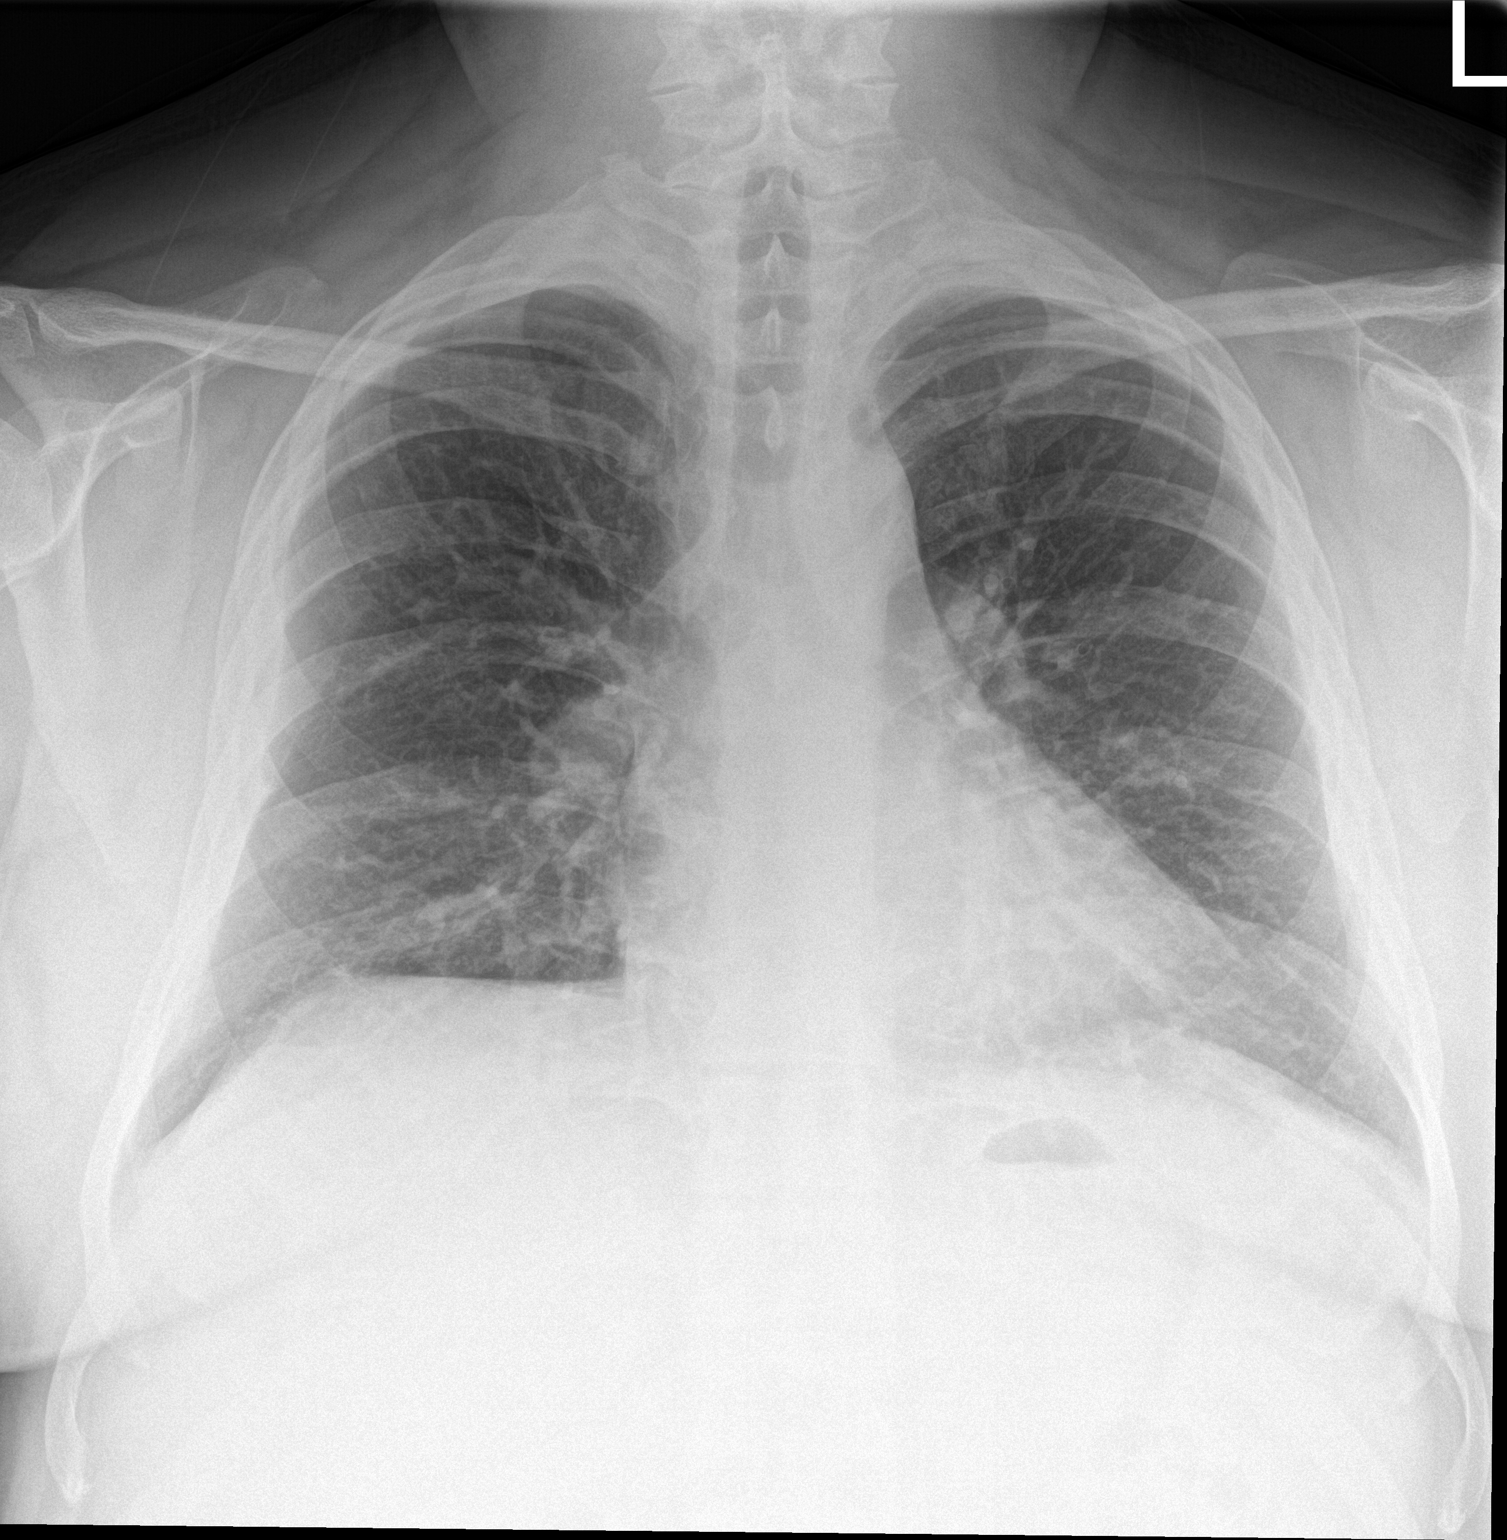

[chest lat]
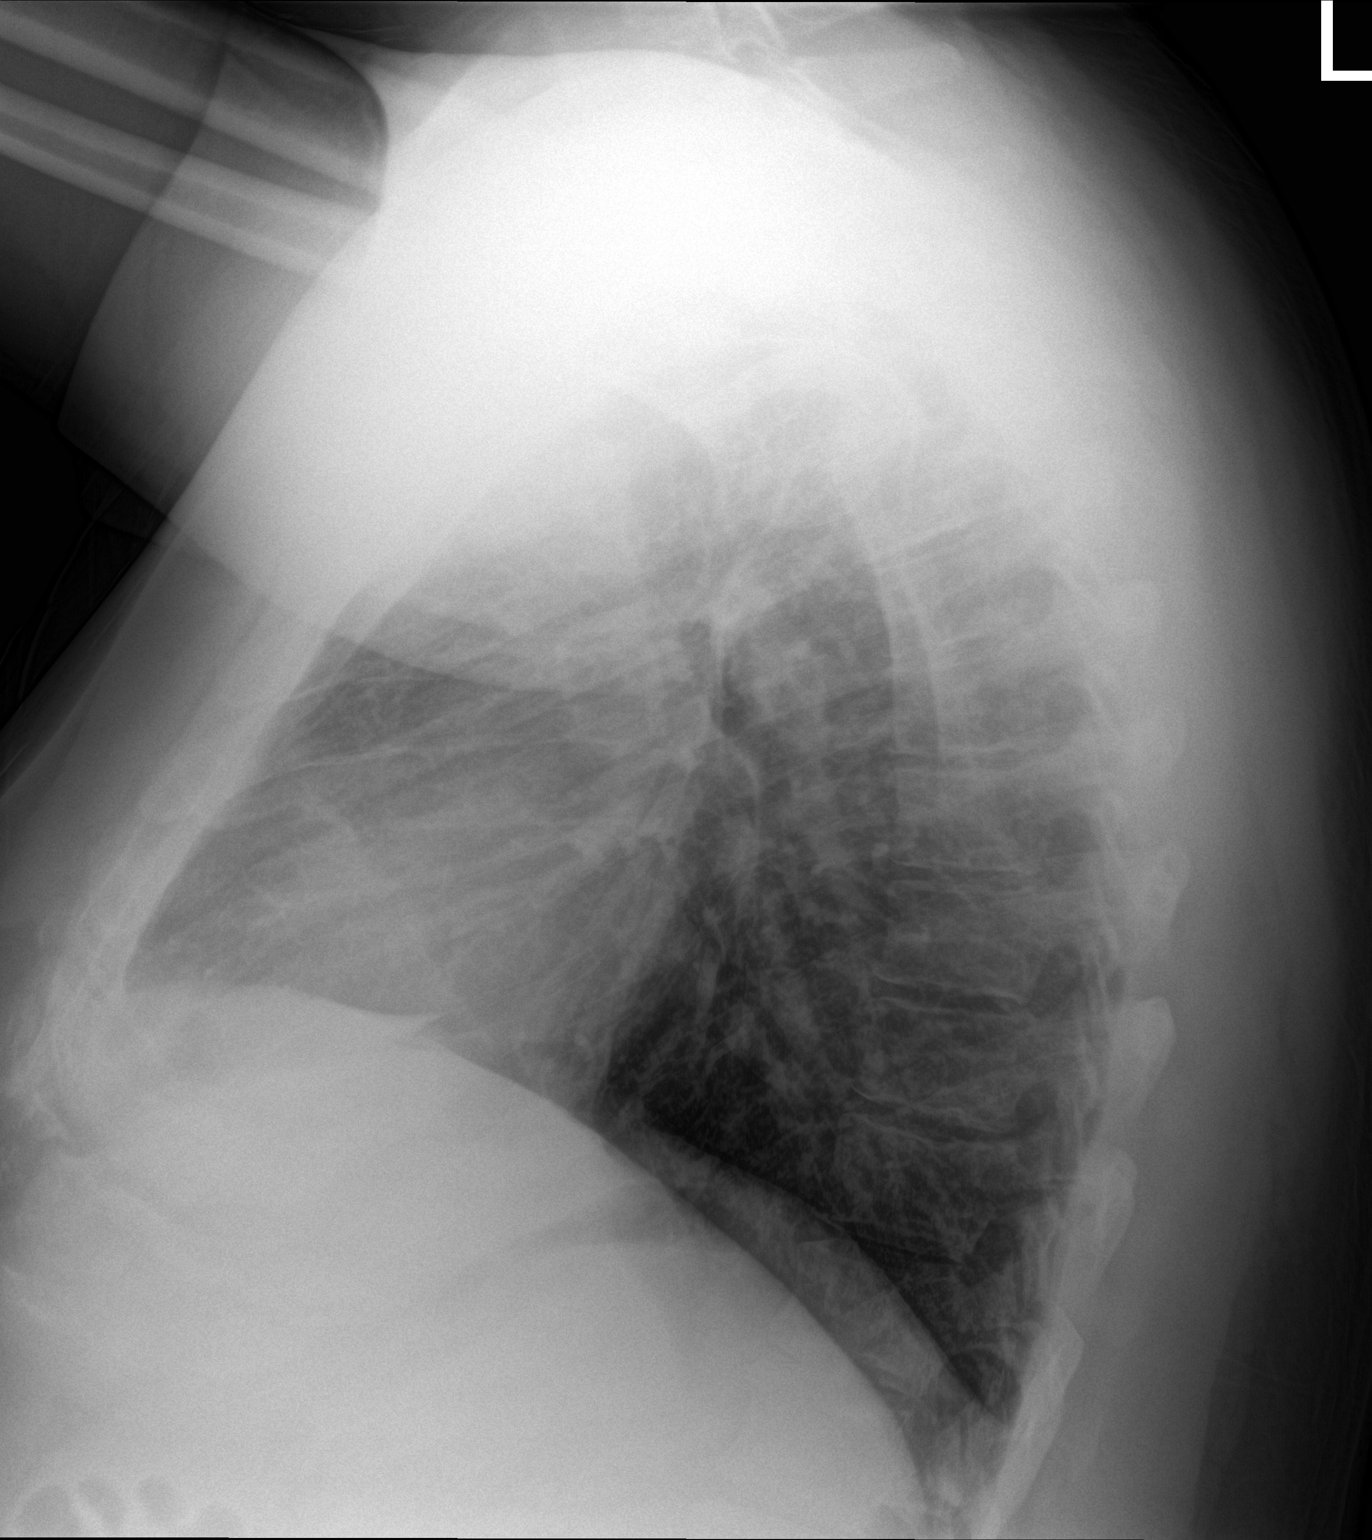

[2 of 2 positions shown; findings below may reference images not displayed]

FINDINGS: heart size is normal. Mediastinal shadows are normal. There slightly
increased interstitial markings diffusely that could go along with
viral/interstitial pneumonitis. No consolidation, collapse or
effusion. Bony structures are unremarkable.
IMPRESSION: Increased in diffuse interstitial markings that could go along with
viral pneumonitis. No consolidation or collapse.

## 2022-05-23 ENCOUNTER — Other Ambulatory Visit: Payer: Self-pay

## 2022-05-23 ENCOUNTER — Encounter: Payer: Self-pay | Admitting: Emergency Medicine

## 2022-05-23 ENCOUNTER — Ambulatory Visit
Admission: EM | Admit: 2022-05-23 | Discharge: 2022-05-23 | Disposition: A | Discharge status: Home / Self Care | Payer: Managed Care, Other (non HMO) | Attending: Nurse Practitioner | Admitting: Nurse Practitioner

## 2022-05-23 DIAGNOSIS — H8110 Benign paroxysmal vertigo, unspecified ear: Secondary | ICD-10-CM

## 2022-05-23 DIAGNOSIS — H6992 Unspecified Eustachian tube disorder, left ear: Secondary | ICD-10-CM | POA: Diagnosis not present

## 2022-05-23 DIAGNOSIS — R110 Nausea: Secondary | ICD-10-CM | POA: Diagnosis not present

## 2022-05-23 DIAGNOSIS — H9312 Tinnitus, left ear: Secondary | ICD-10-CM | POA: Diagnosis not present

## 2022-05-23 MED ORDER — ONDANSETRON HCL 4 MG PO TABS: 4.0000 mg | ORAL_TABLET | Freq: Four times a day (QID) | ORAL | 0 refills | Status: AC

## 2022-05-23 MED ORDER — FLUTICASONE PROPIONATE 50 MCG/ACT NA SUSP: 1.0000 | Freq: Every day | NASAL | 0 refills | Status: AC

## 2022-05-23 MED ORDER — MECLIZINE HCL 25 MG PO TABS: 25.0000 mg | ORAL_TABLET | Freq: Three times a day (TID) | ORAL | 0 refills | Status: AC | PRN

## 2022-05-23 NOTE — ED Provider Notes (Signed)
[facility]    CSN: [id number] Arrival date & time: 05/23/22  1029      History   Chief Complaint Chief Complaint  Patient presents with   Dizziness    HPI George Oconnell is a 37 y.o. male presents for evaluation of dizziness.  Patient reports 1 month of intermittent dizziness with left ear tinnitus/pressure.  Dizziness is intermittent induced by position change and head turning.  States it feels like the room is spinning.  He denies any headache, vomiting, visual changes, unilateral weakness, chest pain, shortness of breath.  He does report resolving URI symptoms prior to onset of symptoms.  Denies syncope or near syncope.  Denies ear pain.  Reports remote episode of vertigo in the past.  He tried Benadryl once but did not feel it helped.  No other concerns at this time.   Dizziness Associated symptoms: tinnitus     Past Medical History:  Diagnosis Date   Allergic rhinitis    History of chicken pox    Steatohepatitis     Patient Active Problem List   Diagnosis Date Noted   FATTY LIVER DISEASE 02/27/2010   HYPERCALCEMIA 02/24/2010   TRANSAMINASES, SERUM, ELEVATED 02/24/2010   OBESITY 01/22/2010   IMPULSE CONTROL DISORDER UNSPECIFIED 01/22/2010   ALLERGIC RHINITIS 01/22/2010   CHEST DISCOMFORT 01/22/2010    Past Surgical History:  Procedure Laterality Date   HERNIA REPAIR     as a child       Home Medications    Prior to Admission medications   Medication Sig Start Date End Date Taking? Authorizing Provider  fluticasone (FLONASE) 50 MCG/ACT nasal spray Place 1 spray into both nostrils daily. 05/23/22  Yes Melynda Ripple, NP  meclizine (ANTIVERT) 25 MG tablet Take 1 tablet (25 mg total) by mouth 3 (three) times daily as needed for dizziness. 05/23/22  Yes Melynda Ripple, NP  ondansetron (ZOFRAN) 4 MG tablet Take 1 tablet (4 mg total) by mouth every 6 (six) hours. 05/23/22  Yes Melynda Ripple, NP  omeprazole (PRILOSEC) 20 MG capsule Take 20 mg by mouth  daily.      [provider]    Family History Family History  Problem Relation Age of Onset   Coronary artery disease Paternal Grandmother    Hypertension Paternal Grandmother    Diabetes Paternal Grandmother    Stroke Other        uncle   Prostate cancer Paternal Grandfather    Prostate cancer Other        paternal great grandfather    Social History Social History   Tobacco Use   Smoking status: Never  Substance Use Topics   Alcohol use: No   Drug use: No     Allergies   Patient has no known allergies.   Review of Systems Review of Systems  HENT:  Positive for tinnitus.   Neurological:  Positive for dizziness.     Physical Exam Triage Vital Signs ED Triage Vitals  Enc Vitals Group     BP 05/23/22 1103 134/86     Pulse Rate 05/23/22 1103 78     Resp 05/23/22 1103 18     Temp 05/23/22 1103 97.9 F (36.6 C)     Temp Source 05/23/22 1103 Oral     SpO2 05/23/22 1103 95 %     Weight --      Height --      Head Circumference --      Peak Flow --  Pain Score 05/23/22 1104 0     Pain Loc --      Pain Edu? --      Excl. in GC? --    No data found.  Updated Vital Signs BP 134/86 (BP Location: Left Arm)   Pulse 78   Temp 97.9 F (36.6 C) (Oral)   Resp 18   SpO2 95%   Visual Acuity Right Eye Distance:   Left Eye Distance:   Bilateral Distance:    Right Eye Near:   Left Eye Near:    Bilateral Near:     Physical Exam Vitals and nursing note reviewed.  Constitutional:      General: He is not in acute distress.    Appearance: Normal appearance. He is not ill-appearing, toxic-appearing or diaphoretic.  HENT:     Head: Normocephalic and atraumatic.     Right Ear: Ear canal normal. No swelling. No middle ear effusion. There is no impacted cerumen. No mastoid tenderness. Tympanic membrane is not perforated or erythematous.     Left Ear: No swelling.  No middle ear effusion. There is no impacted cerumen. No mastoid tenderness. Tympanic  membrane is not perforated or erythematous.     Nose: Nose normal.  Eyes:     Pupils: Pupils are equal, round, and reactive to light.  Cardiovascular:     Rate and Rhythm: Normal rate.  Pulmonary:     Effort: Pulmonary effort is normal.  Skin:    General: Skin is warm and dry.  Neurological:     General: No focal deficit present.     Mental Status: He is alert and oriented to person, place, and time.     GCS: GCS eye subscore is 4. GCS verbal subscore is 5. GCS motor subscore is 6.     Cranial Nerves: No facial asymmetry.     Motor: No weakness.     Coordination: Coordination normal.     Gait: Gait is intact.     Comments: Strength 5 out of 5 bilateral upper extremities  Psychiatric:        Mood and Affect: Mood normal.        Behavior: Behavior normal.      UC Treatments / Results  Labs (all labs ordered are listed, but only abnormal results are displayed) Labs Reviewed - No data to display  EKG   Radiology No results found.  Procedures Procedures (including critical care time)  Medications Ordered in UC Medications - No data to display  Initial Impression / Assessment and Plan / UC Course  I have reviewed the triage vital signs and the nursing notes.  Pertinent labs & imaging results that were available during my care of the patient were reviewed by me and considered in my medical decision making (see chart for details).     Patient in stable condition with no red flags on exam Reviewed exam and sx with patient Discussed symptoms consistent with vertigo Start Flonase for eustachian tube dysfunction Trial of meclizine.  Side effect profile reviewed Zofran prn Nausea Pt verbalized understanding of instruction and all questions answered F/U with PCP or ENT if symptoms do not improve or persist Strict ER precautions reviewed and patient verbalized understanding Pt was discharged in stable condition and in NAD  Final Clinical Impressions(s) / UC Diagnoses    Final diagnoses:  Benign paroxysmal positional vertigo, unspecified laterality  Nausea without vomiting  Eustachian tube dysfunction, left  Tinnitus of left ear     Discharge Instructions  Flonase daily Meclizine as needed for dizziness.  Please note this medication can make you drowsy.  Do not drink alcohol or drive while on this medication Zofran as needed for nausea Rest and increase fluids Follow-up with your PCP or ear nose and throat if your symptoms do not improve or persist Please go to the emergency room if you have any worsening symptoms    ED Prescriptions     Medication Sig Dispense Auth. Provider   fluticasone (FLONASE) 50 MCG/ACT nasal spray Place 1 spray into both nostrils daily. 15.8 mL Radford Pax, NP   meclizine (ANTIVERT) 25 MG tablet Take 1 tablet (25 mg total) by mouth 3 (three) times daily as needed for dizziness. 30 tablet Radford Pax, NP   ondansetron (ZOFRAN) 4 MG tablet Take 1 tablet (4 mg total) by mouth every 6 (six) hours. 12 tablet Radford Pax, NP      PDMP not reviewed this encounter.   Radford Pax, NP 05/23/22 1125

## 2022-05-23 NOTE — Discharge Instructions (Signed)
Flonase daily Meclizine as needed for dizziness.  Please note this medication can make you drowsy.  Do not drink alcohol or drive while on this medication Zofran as needed for nausea Rest and increase fluids Follow-up with your PCP or ear nose and throat if your symptoms do not improve or persist Please go to the emergency room if you have any worsening symptoms

## 2022-05-23 NOTE — ED Triage Notes (Signed)
Pt sts vertigo sx x 1 month worse starting last night with ringing in left ear progressing to dizziness with nausea this am; worse with position change
